# Patient Record
Sex: Female | Born: 1950 | Race: White | Hispanic: No | Marital: Married
Health system: Midwestern US, Academic
[De-identification: ages and names within clinical notes are randomized; demographics above are authoritative.]

---

## 2018-01-18 ENCOUNTER — Encounter: Admit: 2018-01-18 | Discharge: 2018-01-18 | Payer: MEDICARE

## 2018-01-18 ENCOUNTER — Ambulatory Visit: Admit: 2018-01-18 | Discharge: 2018-01-19 | Payer: MEDICARE

## 2018-01-18 DIAGNOSIS — K859 Acute pancreatitis without necrosis or infection, unspecified: Principal | ICD-10-CM

## 2018-01-18 DIAGNOSIS — R42 Dizziness and giddiness: Secondary | ICD-10-CM

## 2018-01-19 DIAGNOSIS — R930 Abnormal findings on diagnostic imaging of skull and head, not elsewhere classified: Principal | ICD-10-CM

## 2018-01-31 ENCOUNTER — Encounter: Admit: 2018-01-31 | Discharge: 2018-01-31 | Payer: MEDICARE

## 2018-02-06 ENCOUNTER — Ambulatory Visit: Admit: 2018-02-06 | Discharge: 2018-02-06 | Payer: MEDICARE

## 2018-02-06 ENCOUNTER — Ambulatory Visit: Admit: 2018-02-06 | Discharge: 2018-02-07 | Payer: MEDICARE

## 2018-02-06 DIAGNOSIS — R930 Abnormal findings on diagnostic imaging of skull and head, not elsewhere classified: ICD-10-CM

## 2018-02-06 DIAGNOSIS — R42 Dizziness and giddiness: Principal | ICD-10-CM

## 2018-02-06 MED ORDER — GADOBENATE DIMEGLUMINE 529 MG/ML (0.1MMOL/0.2ML) IV SOLN
12 mL | Freq: Once | INTRAVENOUS | 0 refills | Status: CP
Start: 2018-02-06 — End: ?
  Administered 2018-02-06: 19:00:00 12 mL via INTRAVENOUS

## 2018-02-07 ENCOUNTER — Encounter: Admit: 2018-02-07 | Discharge: 2018-02-07 | Payer: MEDICARE

## 2018-02-18 ENCOUNTER — Encounter: Admit: 2018-02-18 | Discharge: 2018-02-18 | Payer: MEDICARE

## 2018-03-04 ENCOUNTER — Ambulatory Visit: Admit: 2018-03-04 | Discharge: 2018-03-04 | Payer: MEDICARE

## 2018-03-04 DIAGNOSIS — R930 Abnormal findings on diagnostic imaging of skull and head, not elsewhere classified: Principal | ICD-10-CM

## 2018-03-04 DIAGNOSIS — R42 Dizziness and giddiness: ICD-10-CM

## 2018-03-05 ENCOUNTER — Encounter: Admit: 2018-03-05 | Discharge: 2018-03-05 | Payer: MEDICARE

## 2018-03-11 ENCOUNTER — Encounter: Admit: 2018-03-11 | Discharge: 2018-03-11 | Payer: MEDICARE

## 2018-03-11 DIAGNOSIS — R2990 Unspecified symptoms and signs involving the nervous system: Principal | ICD-10-CM

## 2018-03-12 ENCOUNTER — Encounter: Admit: 2018-03-12 | Discharge: 2018-03-12 | Payer: MEDICARE

## 2018-03-24 ENCOUNTER — Ambulatory Visit: Admit: 2018-03-24 | Discharge: 2018-03-24 | Payer: MEDICARE

## 2018-03-24 DIAGNOSIS — R2990 Unspecified symptoms and signs involving the nervous system: Principal | ICD-10-CM

## 2018-03-24 MED ORDER — GADOBENATE DIMEGLUMINE 529 MG/ML (0.1MMOL/0.2ML) IV SOLN
12 mL | Freq: Once | INTRAVENOUS | 0 refills | Status: CP
Start: 2018-03-24 — End: ?
  Administered 2018-03-24: 19:00:00 12 mL via INTRAVENOUS

## 2018-03-25 ENCOUNTER — Ambulatory Visit: Admit: 2018-03-25 | Discharge: 2018-03-25 | Payer: MEDICARE

## 2018-03-25 DIAGNOSIS — R2990 Unspecified symptoms and signs involving the nervous system: Principal | ICD-10-CM

## 2018-03-25 MED ORDER — GADOBENATE DIMEGLUMINE 529 MG/ML (0.1MMOL/0.2ML) IV SOLN
10 mL | Freq: Once | INTRAVENOUS | 0 refills | Status: CP
Start: 2018-03-25 — End: ?
  Administered 2018-03-25: 21:00:00 10 mL via INTRAVENOUS

## 2018-03-26 ENCOUNTER — Encounter: Admit: 2018-03-26 | Discharge: 2018-03-26 | Payer: MEDICARE

## 2018-03-26 ENCOUNTER — Ambulatory Visit: Admit: 2018-03-26 | Discharge: 2018-03-27 | Payer: MEDICARE

## 2018-03-26 DIAGNOSIS — M859 Disorder of bone density and structure, unspecified: Secondary | ICD-10-CM

## 2018-03-26 DIAGNOSIS — K859 Acute pancreatitis without necrosis or infection, unspecified: Principal | ICD-10-CM

## 2018-03-27 DIAGNOSIS — S2231XA Fracture of one rib, right side, initial encounter for closed fracture: ICD-10-CM

## 2018-03-27 DIAGNOSIS — R202 Paresthesia of skin: ICD-10-CM

## 2018-03-27 DIAGNOSIS — R42 Dizziness and giddiness: Principal | ICD-10-CM

## 2018-03-27 LAB — ANTI-NUCLEAR ANTIBODY(ANA)

## 2018-03-27 LAB — CBC AND DIFF
Lab: 0.1 10*3/uL (ref 0–0.20)
Lab: 0.1 10*3/uL (ref 0–0.45)
Lab: 0.4 10*3/uL (ref 0–0.80)
Lab: 1 % (ref 0–2)
Lab: 1.6 10*3/uL (ref 1.0–4.8)
Lab: 14 % (ref 11–15)
Lab: 14 g/dL (ref 12.0–15.0)
Lab: 2 % (ref 0–5)
Lab: 276 10*3/uL (ref 150–400)
Lab: 28 % (ref 24–44)
Lab: 3.6 10*3/uL (ref 1.8–7.0)
Lab: 30 pg (ref 26–34)
Lab: 5.8 10*3/uL (ref 4.5–11.0)
Lab: 61 % (ref 41–77)
Lab: 8 % (ref 4–12)
Lab: 9 FL (ref 7–11)
Lab: 90 FL (ref 80–100)

## 2018-03-27 LAB — 25-OH VITAMIN D (D2 + D3): Lab: 26 ng/mL — ABNORMAL LOW (ref 30–80)

## 2018-03-27 LAB — PROTIME INR (PT): Lab: 1 M/UL (ref 0.8–1.2)

## 2018-03-27 LAB — VITAMIN B12: Lab: 338 pg/mL (ref 180–914)

## 2018-03-27 LAB — ANTI-NUCLEAR AB(ANA)-QUANT: Lab: 80 — ABNORMAL HIGH (ref <80)

## 2018-03-28 ENCOUNTER — Encounter: Admit: 2018-03-28 | Discharge: 2018-03-28 | Payer: MEDICARE

## 2018-04-18 ENCOUNTER — Encounter: Admit: 2018-04-18 | Discharge: 2018-04-18 | Payer: MEDICARE

## 2018-04-19 ENCOUNTER — Encounter: Admit: 2018-04-19 | Discharge: 2018-04-19 | Payer: MEDICARE

## 2018-04-22 ENCOUNTER — Encounter: Admit: 2018-04-22 | Discharge: 2018-04-22 | Payer: MEDICARE

## 2018-05-08 ENCOUNTER — Encounter: Admit: 2018-05-08 | Discharge: 2018-05-08 | Payer: MEDICARE

## 2018-05-08 ENCOUNTER — Ambulatory Visit: Admit: 2018-05-08 | Discharge: 2018-05-09 | Payer: MEDICARE

## 2018-05-08 DIAGNOSIS — K859 Acute pancreatitis without necrosis or infection, unspecified: Principal | ICD-10-CM

## 2018-05-09 DIAGNOSIS — R42 Dizziness and giddiness: ICD-10-CM

## 2018-05-09 DIAGNOSIS — G379 Demyelinating disease of central nervous system, unspecified: Principal | ICD-10-CM

## 2018-05-10 ENCOUNTER — Encounter: Admit: 2018-05-10 | Discharge: 2018-05-10 | Payer: MEDICARE

## 2018-05-14 ENCOUNTER — Ambulatory Visit: Admit: 2018-05-14 | Discharge: 2018-05-14 | Payer: MEDICARE

## 2018-05-14 DIAGNOSIS — R42 Dizziness and giddiness: Principal | ICD-10-CM

## 2018-05-15 ENCOUNTER — Encounter: Admit: 2018-05-15 | Discharge: 2018-05-15 | Payer: MEDICARE

## 2018-05-21 ENCOUNTER — Encounter: Admit: 2018-05-21 | Discharge: 2018-05-21 | Payer: MEDICARE

## 2018-05-30 ENCOUNTER — Encounter: Admit: 2018-05-30 | Discharge: 2018-05-30 | Payer: MEDICARE

## 2018-06-17 ENCOUNTER — Encounter: Admit: 2018-06-17 | Discharge: 2018-06-17 | Payer: MEDICARE

## 2018-06-17 ENCOUNTER — Ambulatory Visit: Admit: 2018-06-17 | Discharge: 2018-06-17 | Payer: MEDICARE

## 2018-06-17 ENCOUNTER — Ambulatory Visit: Admit: 2018-06-17 | Discharge: 2018-06-18 | Payer: MEDICARE

## 2018-06-17 DIAGNOSIS — K859 Acute pancreatitis without necrosis or infection, unspecified: Secondary | ICD-10-CM

## 2018-06-17 MED ORDER — HYDROCHLOROTHIAZIDE 12.5 MG PO CAP
12.5 mg | ORAL_CAPSULE | Freq: Every morning | ORAL | 3 refills | 30.00000 days | Status: AC
Start: 2018-06-17 — End: 2020-01-15

## 2018-06-18 DIAGNOSIS — R42 Dizziness and giddiness: Secondary | ICD-10-CM

## 2018-06-19 ENCOUNTER — Encounter: Admit: 2018-06-19 | Discharge: 2018-06-19 | Payer: MEDICARE

## 2018-07-31 ENCOUNTER — Ambulatory Visit: Admit: 2018-07-31 | Discharge: 2018-07-31 | Payer: MEDICARE

## 2018-07-31 ENCOUNTER — Encounter: Admit: 2018-07-31 | Discharge: 2018-07-31 | Payer: MEDICARE

## 2018-07-31 DIAGNOSIS — R42 Dizziness and giddiness: Principal | ICD-10-CM

## 2018-07-31 DIAGNOSIS — H903 Sensorineural hearing loss, bilateral: ICD-10-CM

## 2018-07-31 DIAGNOSIS — K859 Acute pancreatitis without necrosis or infection, unspecified: Principal | ICD-10-CM

## 2018-10-20 ENCOUNTER — Encounter: Admit: 2018-10-20 | Discharge: 2018-10-20 | Payer: MEDICARE

## 2018-12-11 ENCOUNTER — Encounter: Admit: 2018-12-11 | Discharge: 2018-12-11

## 2019-01-08 ENCOUNTER — Encounter: Admit: 2019-01-08 | Discharge: 2019-01-08

## 2019-01-09 ENCOUNTER — Encounter: Admit: 2019-01-09 | Discharge: 2019-01-09

## 2019-01-15 ENCOUNTER — Ambulatory Visit: Admit: 2019-01-15 | Discharge: 2019-01-15

## 2019-01-15 ENCOUNTER — Encounter: Admit: 2019-01-15 | Discharge: 2019-01-15

## 2019-01-15 DIAGNOSIS — R42 Dizziness and giddiness: Secondary | ICD-10-CM

## 2019-01-15 DIAGNOSIS — K859 Acute pancreatitis without necrosis or infection, unspecified: Secondary | ICD-10-CM

## 2019-01-15 DIAGNOSIS — G379 Demyelinating disease of central nervous system, unspecified: Principal | ICD-10-CM

## 2019-01-15 NOTE — Assessment & Plan Note
Improved with low salt diet, continue   Follow up with ENT.

## 2019-01-15 NOTE — Progress Notes
Date of Service: 01/15/2019    Subjective:             Rachael Edwards is a 68 y.o. female.    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 She is back to exercising again. She follows up with PCP for her BP and TSH control. She also is receiving Vit B12 injections.           Current Outpatient Medications:   ???  aspirin EC 81 mg tablet, Take 81 mg by mouth daily., Disp: , Rfl:   ???  busPIRone (BUSPAR) 7.5 mg tablet, , Disp: , Rfl:   ???  diazePAM (VALIUM) 5 mg tablet, Take 1 tablet by mouth three times daily., Disp: , Rfl:   ???  fluoxetine (PROZAC) 20 mg capsule, Take 1 capsule by mouth daily., Disp: , Rfl:   ???  hydroCHLOROthiazide (HYDRODIURIL) 12.5 mg capsule, Take one capsule by mouth every morning., Disp: 90 capsule, Rfl: 3  ???  levothyroxine (SYNTHROID) 25 mcg tablet, Take 25 mcg by mouth daily 30 minutes before breakfast., Disp: , Rfl:   ???  loratadine (CLARITIN) 10 mg tablet, Take 1 tablet by mouth daily., Disp: , Rfl:   ???  ondansetron (ZOFRAN ODT) 8 mg rapid dissolve tablet, Dissolve 1 tablet by mouth three times daily., Disp: , Rfl:   ???  triamcinolone (NASACORT) 55 mcg nasal inhaler, Apply 1 spray into nose as directed daily., Disp: , Rfl:      Allergies   Allergen Reactions   ??? Latex, Natural Rubber FLUSHING (SKIN) and RASH   ??? Penicillins HIVES   ??? Cefaclor NAUSEA AND VOMITING   ??? Erythromycin NAUSEA AND VOMITING       Medical History:   Diagnosis Date   ??? Pancreatitis        Family History   Problem Relation Age of Onset   ??? Cancer Father    ??? Thyroid Disease Brother      Social History     Tobacco Use   Smoking Status Former Smoker   ??? Types: Cigarettes   ??? Last attempt to quit: 1974   ??? Years since quitting: 46.6   Smokeless Tobacco Never Used Social History     Substance and Sexual Activity   Alcohol Use Not Currently              Review of Systems   Cardiovascular: Positive for chest pain.   Allergic/Immunologic: Positive for environmental allergies.   All other systems reviewed and are negative.        Objective:         ??? aspirin EC 81 mg tablet Take 81 mg by mouth daily.   ??? busPIRone (BUSPAR) 7.5 mg tablet    ??? diazePAM (VALIUM) 5 mg tablet Take 1 tablet by mouth three times daily.   ??? fluoxetine (PROZAC) 20 mg capsule Take 1 capsule by mouth daily.   ??? hydroCHLOROthiazide (HYDRODIURIL) 12.5 mg capsule Take one capsule by mouth every morning.   ??? levothyroxine (SYNTHROID) 25 mcg tablet Take 25 mcg by mouth daily 30 minutes before breakfast.   ??? loratadine (CLARITIN) 10 mg tablet Take 1 tablet by mouth daily.   ??? ondansetron (ZOFRAN ODT) 8 mg rapid  dissolve tablet Dissolve 1 tablet by mouth three times daily.   ??? triamcinolone (NASACORT) 55 mcg nasal inhaler Apply 1 spray into nose as directed daily.     Vitals:    01/15/19 1417   BP: 120/76   BP Source: Arm, Left Upper   Patient Position: Sitting   Pulse: 66   Temp: 36.7 ???C (98 ???F)   Weight: 47.9 kg (105 lb 9.6 oz)   Height: 152.4 cm (60)   PainSc: Zero     Body mass index is 20.62 kg/m???.     Physical Exam  Depression Screening was performed on Rachael Edwards in clinic today. Based on the score of 0, no follow up action or recommendations are necessary at this time.  Mental Status Exam: Alert oriented to time, person and place,  ???  Speech and Language: Intact for fluency, reception and repetition   ???  ???  Cranial Nerve Exam:   ???  Cranial Nerve II Right Left   Visual Acuity 20/50 20/50   Pupil  Equally reactive to light No RAPD Equally reactive to light No RAPD             ???  Cranial Nerves III-XII Right Left   III, IV, VI (EOM's) Intact  Intact    V Intact  Intact    VII Intact  Intact    VIII Decreased  Decreased more than righ    IX, X Intact  Intact    XI Intact  Intact    XII Intact  Intact    ??? ???           Motor:   ??? R L ??? ??? R L      ??? Hip flexors 5 5      ??? ??? ??? ???   Shoulder Abductors 5 5 ???      Elbow flexors 5 5 ??? ??? ??? ???   Elbow extensors 5 5 ??? Knee flexors 5 5   Wrist flexors 5 5 ??? Knee extensors 5 5   Wrist extensors 5 5 ??? Ankle dorsiflexors 5 5   Finger flexors 5 5 ??? Ankle plantar flexors 5 5   Finger abductors 5 5 ??? ??? ??? ???   Fingers extensors 5 5 ??? ??? ??? ???   ???  Bulk and Tone:  ???  Upper Extremity R L   Atrophy Intact  Intact    Increased Tone Intact  Intact    ???  Lower Extremity R L   Atrophy Intact  Intact    Increased Tone Intact  Intact    ???  Reflexes ??? ???   ??? R L   Biceps ++ ++   Brachioradialis ++ ++   Triceps ++ ++   Knee ++ ++   Ankle ++ ++   ???  Reflexes R L   Hoffman's -ve  -ve   Plantar Equivocal  Equivocal   ???     Sensory Examination temp:  R L   Upper Extremity Intact  Intact    Lower Extremity Intact  Intact       ???  ???       Sensory examination: Vibration in Seconds   ??? R L   Fingers 17 secs  15  secs    Toes 12 secs  8 secs    Ankle ??? ???   ???  Cerebellar/Fine Motor R L   Finger nose finger Intact  Intact    Heel to shin  Intact  Intact    Rapid alternating movements Intact  Intact    ???  Gait: stable no ataxia   Amb index: 4.84  9 hole peg Rh 22.65 , LH 23.12  ???       Assessment and Plan:    Problem   Demyelinating Changes in Brain ([facility])    MRI brain 01/2019  Stable WM lesions consistent with Demyelinating changes   MRI C and T spine no lesions        Vertigo        Demyelinating changes in brain [facility])  The patient is clinically stable   I reviewed the brain MRI and I agree with the report that these are stable   I would continue to monitor off DMT   I advised her to follow up with PCP for BP and lipids control   Repeat MRI of the brain in 1 year wo contrast   Follow up with vitamin B12 and vit D levels with PCP      Vertigo  Improved with low salt diet, continue   Follow up with ENT.

## 2020-01-09 ENCOUNTER — Encounter: Admit: 2020-01-09 | Discharge: 2020-01-09 | Payer: MEDICARE

## 2020-01-14 NOTE — Progress Notes
[facility]  Neuroimmunology/ Neuroinfectious Disease  New Patient Visit - Referred by Dr. Charline Bills  -----------------------------------------------------------------------------------------------------------------------------------------  Chief complaint: dizziness    Sources of history: patient.    History of present illness:  Patient is a 69 y.o. female presenting for further evaluation.  Patient previously followed by Dr. Charline Bills for ongoing dizziness with question of CNS lesions consistent with MS. On prior workup and surveillance imaging, patient with numerous supratentorial periventricular and subcortical white matter lesions consistent with previous disease. However, there has been no enhancement or new lesions seen on subsequent imaging, last obtained in 01/2019. Patient with ongoing dizziness, diagnosed with Menierres disease by Dr. France Ravens, and helped by low sodium diet.     Patient recently went on a trip earlier this month when she had her traditional vertigo. Because of balance problems, had two times where she had fell. Had her zofran and diazepam on board. Had nausea for 15-20 minutes. Per patient, was on salt substitute and had not had increased salt since she's been back. Patient still receiving B12 injections approximately once per month, next due 19th of this month.     ===============================================       EVALUATION/TESTING:  Labs  ANA titer 80, equivocal  Vitami D 26.6 (03/26/18)  B12: 338 (03/26/18)    Physiological Testing  Routine EEG: -  EMU/ vEEG:  -    Basic Structural Imaging (personally reviewed unless otherwise stated)  MRI brain wwo [facility], 01/15/2019):  1. Stable MRI of the head. Unchanged nonenhancing supratentorial white matter lesions. Differential considerations include primary demyelinating disorder, chronic small vessel ischemia, migraine headaches or sequela of prior infectious or inflammatory etiologies.  2. No new lesions. No enhancing lesions.    MRI T/ L Spine ([place], 03/25/18)  IMPRESSION   1. ?No thoracic/lumbar spinal cord signal abnormality or abnormal   enhancement.   2. ?No significant spinal or foraminal stenosis in the thoracic spine.   3. ?Prior L4-L5 laminectomies. 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     MRI C-Spine [facility], 02/06/18)  C-spine:  1.  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  2.  Multilevel degenerative neural foraminal stenosis, greatest of marked on the right at C3-C4 and C5-C6.  3.  Normal signal of the cervical cord without enhancing lesion.    CT Int [facility], 05/14/18):  IMPRESSION   1. 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   2. No evidence of temporal bone fracture or encephalocele.   3. Chronic appearing inferior mastoid sclerosis and mild mucosal   thickening or trace effusion fluid favored for sequelae of old   inflammation      ===============================================  OTHER HISTORY  Past Medical History:  Medical History:   Diagnosis Date   ? Pancreatitis        Past Surgical History:  Surgical History:   Procedure Laterality Date   ? CHOLECYSTECTOMY  1998   ? HYSTERECTOMY  2000   ? BACK SURGERY  1985, 2017       Family History:  Family History   Problem Relation Age of Onset   ? Cancer Father    ? Thyroid Disease Brother        Social History:  Social History     Tobacco Use   ? Smoking status: Former Smoker     Types: Cigarettes  Quit date: 19     Years since quitting: 47.6   ? Smokeless tobacco: Never Used   Substance Use Topics   ? Alcohol use: Not Currently   ? Drug use: Not Currently       Allergies:  Allergies   Allergen Reactions   ? Latex, Natural Rubber FLUSHING (SKIN) and RASH   ? Penicillins HIVES   ? Cefaclor NAUSEA AND VOMITING   ? Erythromycin NAUSEA AND VOMITING       Medications:    Current Outpatient Medications:   ?  aspirin EC 81 mg tablet, Take 81 mg by mouth daily., Disp: , Rfl:   ?  busPIRone (BUSPAR) 7.5 mg tablet, , Disp: , Rfl:   ?  diazePAM (VALIUM) 5 mg tablet, Take 1 tablet by mouth three times daily., Disp: , Rfl:   ?  fluoxetine (PROZAC) 20 mg capsule, Take 1 capsule by mouth daily., Disp: , Rfl:   ?  hydroCHLOROthiazide (HYDRODIURIL) 12.5 mg capsule, Take one capsule by mouth every morning., Disp: 90 capsule, Rfl: 3  ?  levothyroxine (SYNTHROID) 25 mcg tablet, Take 25 mcg by mouth daily 30 minutes before breakfast., Disp: , Rfl:   ?  loratadine (CLARITIN) 10 mg tablet, Take 1 tablet by mouth daily., Disp: , Rfl:   ?  ondansetron (ZOFRAN ODT) 8 mg rapid dissolve tablet, Dissolve 1 tablet by mouth three times daily., Disp: , Rfl:   ?  triamcinolone (NASACORT) 55 mcg nasal inhaler, Apply 1 spray into nose as directed daily., Disp: , Rfl:     Review of Systems:  Review of Systems   Constitutional: Positive for malaise/fatigue. Negative for chills and fever.   HENT: Positive for hearing loss.    Eyes: Negative for double vision.   Respiratory: Negative for cough.    Cardiovascular: Negative for chest pain and palpitations.   Genitourinary: Negative for dysuria.   Musculoskeletal: Negative for myalgias.   Neurological: Positive for dizziness. Negative for loss of consciousness and headaches.   Psychiatric/Behavioral: Negative for depression and memory loss.          PHYSICAL EXAMINATION:  Vitals:   Vitals:    01/15/20 0939   BP: 117/72   Pulse: 80   Weight: 48.1 kg (106 lb)   Height: 152.4 cm (60)   PainSc: Zero      General:  No scleral icterus  Moist mucus membranes  Peripheral pulses Intact  Aerating well  Abdomen soft  No peripheral edema  Skin dry    Neurologic Exam:  Mental status:  ? Alert and oriented x 3  ? Recent and remote memory intact  ? Concentration and attention intact  ? Fund of knowledge intact; mood and affect normal  ? Language including naming, repetition, comprehension is normal and spontaneous speech is normal    Cranial nerves:  ? Eyes - Fundoscopic exam, unable to visualize optic discs  ? Visual acuity is normal and visual fields to confrontation are normal  ? Ocular motility full and pupils are equal round and reactive to light, no RAPD  ? Facial sensation intact in V1-V3   ? Face symmetric and strength normal  ? Hearing decreased to finger rustle bilaterally, R > L  ? Palate elevation, tongue protrusion deferred due to COVID pandemic  ? Shoulder shrug is symmetric    Motor:  ? Muscle bulk and tone are normal in all extremities  ? Muscle strength is normal to confrontation in all extremities    Reflexes:  ?  2+ and symmetric in all four extremities    Sensory:  ? Sensory exam is normal to light touch and temperature    Coordination:  ? Finger to nose testing without dysmetria    Gait:  ? Casual gait and station are normal    ===========================================================================    Impression:  1. Meniere disease, bilateral     2. Vertigo  VITAMIN B12   3. Abnormal MRI of head  VITAMIN B12   4. Disorder of bone density and structure, unspecified  25-OH VITAMIN D (D2 + D3)       VALEN CROFFORD is a 69 y.o. presenting to establish care for previous white matter changes with concern for multiple sclerosis vs clinically isolated syndrome (CIS). Patient previously followed by Dr. Charline Bills, last seen in clinic in 01/2019. Her serial imaging since that time has not demonstrated new lesions on MRI brain or spine. She has remained well without initiation of DMT. 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 She has been symptom free since last winter with only recurrence coming after traveling to [place] for a trip. She has diazepam and zofran for symptomatic relief, but has not required it since coming back. Believe her Menierre's disease is currently stable.     Plan/ Recommendations:  Diagnostic testing:  Ok to differ surveillance MRI brain at this time due to age, clinical stability, and lack of new symtpoms  Would like to obtain annual Vit D level, previously noted to be low  Obtain B12 level as currently on monthly supplementation to help gauge duration     Treatment:  No indication currently for DMT  Continue low Na diet as have been for Menierre's disease  Start Vit D supplementation, D3 5000 IU/ daily    Referrals:  - None.    Follow-up:  - 1 year with me  - Given fatigue and recently started synthroid, encourage follow up with PCP for follow up thyroid studies and further adjustment as needed.     Considerable time was spent on counseling the patient on the following, taking time to answer their quesions for which they verbalized understanding:  - The diagnosis was discussed including its probable causes, an explanation of the terminology, and the prognosis.     - The plan for evaluation and the treatment.    Time-based billing: Of the 60 minute encounter, > 50% of the time was spent on counseling as above.    Waldron Session, M.D.  [facility]  [facility]

## 2020-01-15 ENCOUNTER — Encounter: Admit: 2020-01-15 | Discharge: 2020-01-15 | Payer: MEDICARE

## 2020-01-15 ENCOUNTER — Ambulatory Visit: Admit: 2020-01-15 | Discharge: 2020-01-15 | Payer: MEDICARE

## 2020-01-15 DIAGNOSIS — R930 Abnormal findings on diagnostic imaging of skull and head, not elsewhere classified: Secondary | ICD-10-CM

## 2020-01-15 DIAGNOSIS — M859 Disorder of bone density and structure, unspecified: Secondary | ICD-10-CM

## 2020-01-15 DIAGNOSIS — R42 Dizziness and giddiness: Secondary | ICD-10-CM

## 2020-01-15 DIAGNOSIS — K859 Acute pancreatitis without necrosis or infection, unspecified: Secondary | ICD-10-CM

## 2020-01-15 DIAGNOSIS — G379 Demyelinating disease of central nervous system, unspecified: Secondary | ICD-10-CM

## 2020-01-15 DIAGNOSIS — H8103 Meniere's disease, bilateral: Secondary | ICD-10-CM

## 2020-01-15 LAB — VITAMIN B12: Lab: 438 pg/mL (ref 180–914)

## 2020-01-15 NOTE — Patient Instructions
Planned Diagnostic Testing:  D3 and B12 labs  Ok to defer MRI    Medications:   Please start Vitamin D3 at 5000 IU/ daily    Return to Clinic:  1 year with me

## 2020-03-20 ENCOUNTER — Encounter: Admit: 2020-03-20 | Discharge: 2020-03-20 | Payer: MEDICARE

## 2020-05-06 ENCOUNTER — Encounter: Admit: 2020-05-06 | Discharge: 2020-05-06 | Payer: 59

## 2020-05-06 ENCOUNTER — Ambulatory Visit: Admit: 2020-05-06 | Discharge: 2020-05-06 | Payer: 59

## 2020-05-06 ENCOUNTER — Ambulatory Visit: Admit: 2020-05-06 | Discharge: 2020-05-06 | Payer: MEDICARE

## 2020-05-06 DIAGNOSIS — K859 Acute pancreatitis without necrosis or infection, unspecified: Secondary | ICD-10-CM

## 2020-05-06 DIAGNOSIS — H903 Sensorineural hearing loss, bilateral: Secondary | ICD-10-CM

## 2020-05-06 DIAGNOSIS — R42 Dizziness and giddiness: Secondary | ICD-10-CM

## 2020-05-06 MED ORDER — HYDROCHLOROTHIAZIDE 12.5 MG PO CAP
12.5 mg | ORAL_CAPSULE | Freq: Every morning | ORAL | 0 refills | 30.00000 days | Status: AC
Start: 2020-05-06 — End: ?

## 2020-05-06 NOTE — Progress Notes
Date of Service: 05/06/2020    Subjective:             Rachael Edwards is a 69 y.o. female.    History of Present Illness    Has been having more attacks since August.       Review of Systems   Constitutional: Negative.    HENT: Negative.    Eyes: Negative.    Respiratory: Negative.    Cardiovascular: Negative.    Gastrointestinal: Negative.    Endocrine: Negative.    Genitourinary: Negative.    Musculoskeletal: Negative.    Skin: Negative.    Allergic/Immunologic: Negative.  Negative for environmental allergies.   Neurological: Negative.    Hematological: Negative.    Psychiatric/Behavioral: Negative.          Objective:          aspirin EC 81 mg tablet Take 81 mg by mouth daily.    busPIRone (BUSPAR) 7.5 mg tablet     diazePAM (VALIUM) 5 mg tablet Take 1 tablet by mouth three times daily.    fluoxetine (PROZAC) 20 mg capsule Take 1 capsule by mouth daily.    levothyroxine (SYNTHROID) 25 mcg tablet Take 25 mcg by mouth daily 30 minutes before breakfast.    ondansetron (ZOFRAN ODT) 8 mg rapid dissolve tablet Dissolve 1 tablet by mouth three times daily.    triamcinolone (NASACORT) 55 mcg nasal inhaler Apply 1 spray into nose as directed daily.     Vitals:    05/06/20 1156   BP: 109/71   Pulse: 71   Weight: 49 kg (108 lb)   Height: 152.4 cm (60")   PainSc: Zero     Body mass index is 21.09 kg/m.     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

## 2020-10-11 ENCOUNTER — Encounter: Admit: 2020-10-11 | Discharge: 2020-10-11 | Payer: 59

## 2020-11-24 ENCOUNTER — Encounter: Admit: 2020-11-24 | Discharge: 2020-11-24 | Payer: 59

## 2020-11-24 MED ORDER — HYDROCHLOROTHIAZIDE 12.5 MG PO CAP
12.5 mg | ORAL_CAPSULE | Freq: Every morning | ORAL | 0 refills | 30.00000 days | Status: AC
Start: 2020-11-24 — End: ?

## 2020-11-26 ENCOUNTER — Encounter: Admit: 2020-11-26 | Discharge: 2020-11-26 | Payer: 59

## 2020-11-29 ENCOUNTER — Ambulatory Visit: Admit: 2020-11-29 | Discharge: 2020-11-30 | Payer: MEDICARE

## 2020-11-29 ENCOUNTER — Encounter: Admit: 2020-11-29 | Discharge: 2020-11-29 | Payer: 59

## 2020-11-29 DIAGNOSIS — H8103 Meniere's disease, bilateral: Secondary | ICD-10-CM

## 2020-11-29 DIAGNOSIS — G379 Demyelinating disease of central nervous system, unspecified: Secondary | ICD-10-CM

## 2020-11-29 DIAGNOSIS — K859 Acute pancreatitis without necrosis or infection, unspecified: Secondary | ICD-10-CM

## 2020-11-29 DIAGNOSIS — R42 Dizziness and giddiness: Secondary | ICD-10-CM

## 2020-11-29 DIAGNOSIS — R930 Abnormal findings on diagnostic imaging of skull and head, not elsewhere classified: Secondary | ICD-10-CM

## 2020-11-29 NOTE — Progress Notes
[facility]  Neuroimmunology/ Neuroinfectious Disease  Return Patient Visit  ===========================================================================  Impression:  No diagnosis found.     Rachael Edwards is a 70 y.o. female presenting for followup  for previous white matter changes with concern for multiple sclerosis vs clinically isolated syndrome (CIS). Her serial imaging since that time has not demonstrated new lesions on MRI brain or spine. She has remained well without initiation of DMT. 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 More recently, she has had some breakthrough symptoms in the setting of infection and vaccination, but improving since then. She has diazepam and zofran for symptomatic relief, asked to take it proactively if needed for upcoming trip to [place].      Plan/ Recommendations:  Diagnostic testing:  Would like defer MRI at this time given lack of new symptoms. If persistent or worsening, will obtain.     Treatment:  No indication currently for DMT  Continue low Na diet as have been for Menierre's disease  Continue Vit D supplementation, D3 5000 IU/ daily    Referrals:  - None.    Follow-up:  - 6 months with me    Considerable time was spent on counseling the patient on the following, taking time to answer their quesions for which they verbalized understanding:  - The diagnosis was discussed including its probable causes, an explanation of the terminology, and the prognosis.     - The plan for evaluation and the treatment.    Waldron Session, M.D.  [facility]  [facility]      ===============================================    Chief complaint: dizziness   Sources of history: patient.    Interval history:  Per patient, had COVID the first week of May. In addition, had Shingles shot last week where symptoms came on again. Vertigo seems to be the biggest problem, episodic. Can go months between events, so the last couple months have been unusual. Denies any other symptoms currently. States hearing has been slightly worsening with new hearing aid settings last year. She continues to follow with her audiologist. No other concerns at this time.       Previous history, updated and reviewed.  Patient is a 70 y.o. female presenting for further evaluation.  Patient previously followed by Dr. Charline Bills for ongoing dizziness with question of CNS lesions consistent with MS. On prior workup and surveillance imaging, patient with numerous supratentorial periventricular and subcortical white matter lesions consistent with previous disease. However, there has been no enhancement or new lesions seen on subsequent imaging, last obtained in 01/2019. Patient with ongoing dizziness, diagnosed with Menierres disease by Dr. France Ravens, and helped by low sodium diet.     Patient recently went on a trip earlier this month when she had her traditional vertigo. Because of balance problems, had two times where she had fell. Had her zofran and diazepam on board. Had nausea for 15-20 minutes. Per patient, was on salt substitute and had not had increased salt since she's been back. Patient still receiving B12 injections approximately once per month, next due 19th of this month.     ===============================================       EVALUATION/TESTING:  Labs  ANA titer 80, equivocal  Vitami D 26.6 (03/26/18)  B12: 338 (03/26/18)    Physiological Testing  Routine EEG: -  EMU/ vEEG:  -    Basic Structural Imaging (personally reviewed unless otherwise stated)  MRI brain wwo [facility], 01/15/2019):  1. Stable MRI of the head. Unchanged nonenhancing supratentorial white matter lesions. Differential considerations include primary demyelinating disorder, chronic small vessel ischemia, migraine headaches or sequela of prior infectious or inflammatory etiologies.  2. No new lesions. No enhancing lesions.    MRI T/ L Spine ([facility], 03/25/18)  IMPRESSION   1. ?No thoracic/lumbar spinal cord signal abnormality or abnormal   enhancement.   2. ?No significant spinal or foraminal stenosis in the thoracic spine.   3. ?Prior L4-L5 laminectomies. 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     MRI C-Spine [facility], 02/06/18)  C-spine:  1.  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  2.  Multilevel degenerative neural foraminal stenosis, greatest of marked on the right at C3-C4 and C5-C6.  3.  Normal signal of the cervical cord without enhancing lesion.    CT Int Hermenia Bers [facility], 05/14/18):  IMPRESSION   1. 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   2. No evidence of temporal bone fracture or encephalocele.   3. Chronic appearing inferior mastoid sclerosis and mild mucosal   thickening or trace effusion fluid favored for sequelae of old   inflammation      ===============================================  OTHER HISTORY  Past Medical History:  Medical History:   Diagnosis Date   ? Pancreatitis        Past Surgical History:  Surgical History:   Procedure Laterality Date   ? CHOLECYSTECTOMY  1998   ? HYSTERECTOMY  2000   ? BACK SURGERY  1985, 2017       Family History:  Family History   Problem Relation Age of Onset   ? Cancer Father    ? Thyroid Disease Brother        Social History:  Social History     Tobacco Use   ? Smoking status: Former Smoker     Types: Cigarettes     Quit date: 1974     Years since quitting: 48.5   ? Smokeless tobacco: Never Used   Substance Use Topics   ? Alcohol use: Not Currently   ? Drug use: Not Currently       Allergies:  Allergies   Allergen Reactions   ? Latex, Natural Rubber FLUSHING (SKIN) and RASH   ? Penicillins HIVES   ? Cefaclor NAUSEA AND VOMITING   ? Erythromycin NAUSEA AND VOMITING Medications:    Current Outpatient Medications:   ?  aspirin EC 81 mg tablet, Take 81 mg by mouth daily., Disp: , Rfl:   ?  busPIRone (BUSPAR) 7.5 mg tablet, , Disp: , Rfl:   ?  diazePAM (VALIUM) 5 mg tablet, Take 1 tablet by mouth three times daily., Disp: , Rfl:   ?  fluoxetine (PROZAC) 20 mg capsule, Take 1 capsule by mouth daily., Disp: , Rfl:   ?  hydroCHLOROthiazide 12.5 mg capsule, TAKE ONE CAPSULE BY MOUTH EVERY MORNING, Disp: 90 capsule, Rfl: 0  ?  levothyroxine (SYNTHROID) 25 mcg tablet, Take 25 mcg by mouth daily 30 minutes before breakfast., Disp: , Rfl:   ?  ondansetron (ZOFRAN ODT) 8 mg rapid dissolve tablet, Dissolve 1 tablet by mouth three times daily., Disp: , Rfl:   ?  triamcinolone (NASACORT) 55 mcg nasal inhaler, Apply 1 spray into nose as directed daily., Disp: , Rfl:  Review of Systems:  Review of Systems   Constitutional: Positive for malaise/fatigue. Negative for chills and fever.   HENT: Positive for hearing loss.    Eyes: Negative for double vision.   Respiratory: Negative for cough.    Cardiovascular: Negative for chest pain and palpitations.   Genitourinary: Negative for dysuria.   Musculoskeletal: Negative for myalgias.   Neurological: Positive for dizziness. Negative for loss of consciousness and headaches.   Psychiatric/Behavioral: Negative for depression and memory loss.          PHYSICAL EXAMINATION:  Vitals:   Vitals:    11/29/20 1254   BP: 102/66   Pulse: 66   PainSc: Zero   Weight: 46.7 kg (103 lb)   Height: 152.4 cm (5')      General:  No scleral icterus  Moist mucus membranes  Peripheral pulses Intact  Aerating well  Abdomen soft  No peripheral edema  Skin dry    Neurologic Exam:  Mental status:  ? Alert and oriented x 3  ? Recent and remote memory intact  ? Concentration and attention intact  ? Fund of knowledge intact; mood and affect normal  ? Language including naming, repetition, comprehension is normal and spontaneous speech is normal    Cranial nerves:  ? Eyes - Fundoscopic exam, unable to visualize optic discs  ? Visual acuity is normal and visual fields to confrontation are normal  ? Ocular motility full and pupils are equal round and reactive to light, no RAPD  ? Facial sensation intact in V1-V3   ? Face symmetric and strength normal  ? Hearing decreased to finger rustle bilaterally, R > L  ? Palate elevation, tongue protrusion deferred due to COVID pandemic  ? Shoulder shrug is symmetric    Motor:  ? Muscle bulk and tone are normal in all extremities  ? Muscle strength is normal to confrontation in all extremities except for right hip flexor with slight weakness noted, 4+/5    Reflexes:  ? 2+ and symmetric in all four extremities    Sensory:  ? Sensory exam is normal to light touch and temperature    Coordination:  ? Finger to nose testing without dysmetria    Gait:  ? Casual gait and station are normal

## 2020-12-13 ENCOUNTER — Encounter: Admit: 2020-12-13 | Discharge: 2020-12-13 | Payer: 59

## 2021-01-03 ENCOUNTER — Encounter: Admit: 2021-01-03 | Discharge: 2021-01-03 | Payer: 59

## 2021-01-11 ENCOUNTER — Encounter: Admit: 2021-01-11 | Discharge: 2021-01-11 | Payer: 59

## 2021-02-01 ENCOUNTER — Encounter: Admit: 2021-02-01 | Discharge: 2021-02-01 | Payer: 59

## 2021-02-10 ENCOUNTER — Encounter: Admit: 2021-02-10 | Discharge: 2021-02-10 | Payer: 59

## 2021-02-10 ENCOUNTER — Ambulatory Visit: Admit: 2021-02-10 | Discharge: 2021-02-10 | Payer: 59

## 2021-02-10 ENCOUNTER — Ambulatory Visit: Admit: 2021-02-10 | Discharge: 2021-02-10 | Payer: MEDICARE

## 2021-02-10 DIAGNOSIS — H903 Sensorineural hearing loss, bilateral: Secondary | ICD-10-CM

## 2021-02-10 DIAGNOSIS — R42 Dizziness and giddiness: Secondary | ICD-10-CM

## 2021-02-10 DIAGNOSIS — G379 Demyelinating disease of central nervous system, unspecified: Secondary | ICD-10-CM

## 2021-02-10 DIAGNOSIS — K859 Acute pancreatitis without necrosis or infection, unspecified: Secondary | ICD-10-CM

## 2021-02-10 MED ORDER — TRIAMTERENE-HYDROCHLOROTHIAZID 37.5-25 MG PO CAP
1 | ORAL_CAPSULE | Freq: Every morning | ORAL | 0 refills | Status: DC
Start: 2021-02-10 — End: 2021-02-10

## 2021-02-10 MED ORDER — TRIAMTERENE-HYDROCHLOROTHIAZID 37.5-25 MG PO CAP
1 | ORAL_CAPSULE | Freq: Every morning | ORAL | 0 refills | Status: AC
Start: 2021-02-10 — End: ?

## 2021-02-10 MED ORDER — GADOBENATE DIMEGLUMINE 529 MG/ML (0.1MMOL/0.2ML) IV SOLN
8 mL | Freq: Once | INTRAVENOUS | 0 refills | Status: CP
Start: 2021-02-10 — End: ?
  Administered 2021-02-10: 17:00:00 8 mL via INTRAVENOUS

## 2021-02-10 NOTE — Progress Notes
Date of Service: 02/10/2021    Subjective:             Rachael Edwards is a 70 y.o. female.    History of Present Illness    Stopped dyazide in April and has started having attacks again.  Left ear is fluctuating.       Review of Systems   Constitutional: Negative.    HENT: Negative.    Eyes: Negative.    Respiratory: Negative.    Cardiovascular: Negative.    Gastrointestinal: Negative.    Endocrine: Negative.    Genitourinary: Negative.    Musculoskeletal: Negative.    Skin: Negative.    Allergic/Immunologic: Negative.    Neurological: Negative.    Hematological: Negative.    Psychiatric/Behavioral: Negative.    All other systems reviewed and are negative.        Objective:          aspirin EC 81 mg tablet Take 81 mg by mouth daily.    busPIRone (BUSPAR) 7.5 mg tablet twice daily.    diazePAM (VALIUM) 5 mg tablet Take 1 tablet by mouth three times daily.    fluoxetine (PROZAC) 20 mg capsule Take 1 capsule by mouth daily.    hydroCHLOROthiazide 12.5 mg capsule TAKE ONE CAPSULE BY MOUTH EVERY MORNING    levothyroxine (SYNTHROID) 25 mcg tablet Take 25 mcg by mouth daily 30 minutes before breakfast.    ondansetron (ZOFRAN ODT) 8 mg rapid dissolve tablet Dissolve 1 tablet by mouth three times daily.    triamcinolone (NASACORT) 55 mcg nasal inhaler Apply 1 spray into nose as directed daily.     Vitals:    02/10/21 0907   BP: 118/74   Pulse: 76   PainSc: Zero   Weight: 44.5 kg (98 lb)   Height: 152.4 cm (5')     Body mass index is 19.14 kg/m.     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  If she has more falls she would be a candidate for a left ELS surgery.  Also discussed [employer] clinical trial.

## 2021-02-14 ENCOUNTER — Encounter: Admit: 2021-02-14 | Discharge: 2021-02-14 | Payer: 59

## 2021-02-14 DIAGNOSIS — Z006 Encounter for examination for normal comparison and control in clinical research program: Secondary | ICD-10-CM

## 2021-02-15 ENCOUNTER — Encounter: Admit: 2021-02-15 | Discharge: 2021-02-15 | Payer: 59

## 2021-03-07 IMAGING — MG 3D MAMMO BILAT DIAGNOSITC
8 of 15 series · 8 of 35 positions shown · non-contrast
Comparison: Mammograms of 05/27/2015 and 04/16/2014.

3D MAMMO BILAT DIAGNOSITC
INDICATION: Pain and lump in the left breast.
TECHNIQUE: 2D and 3D bilateral diagnostic mammography was performed with  

 CAD. A BB marker was placed at the area of lump and pain in the  
 upper left breast.

[R MLO]
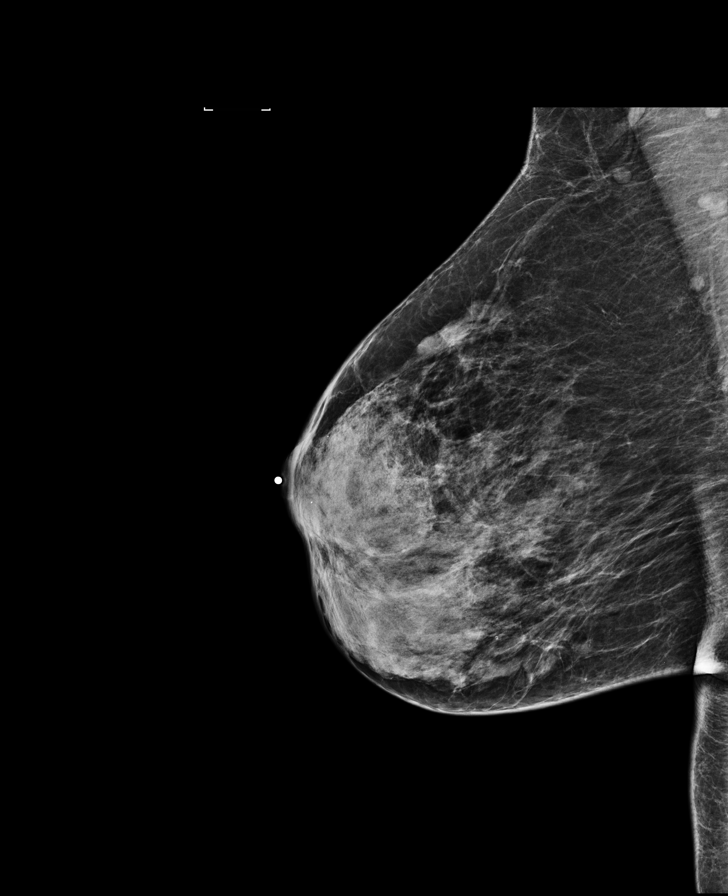

[L MLO synth-2D]
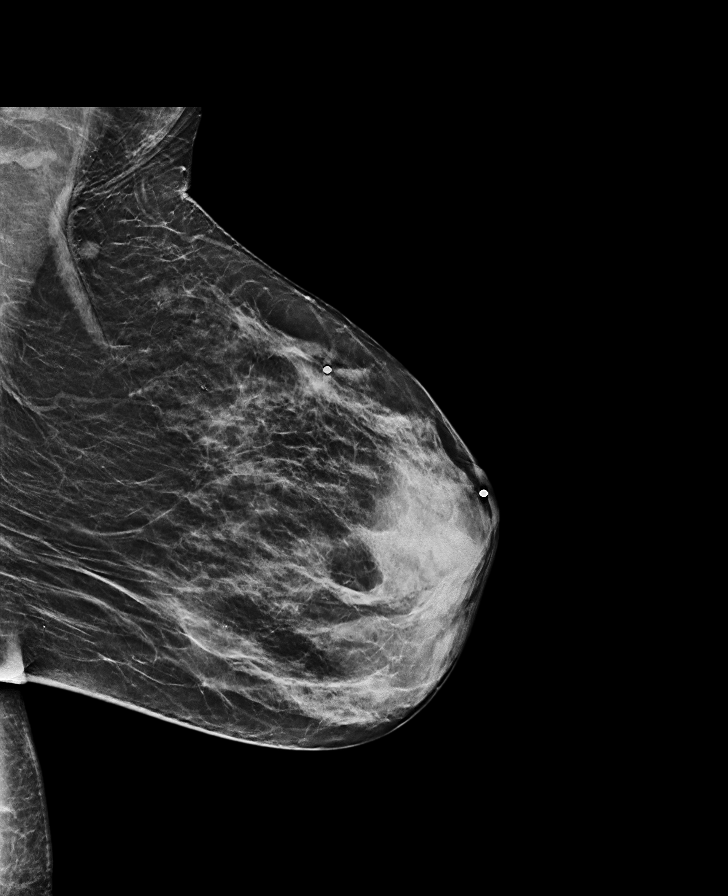

[L ML]
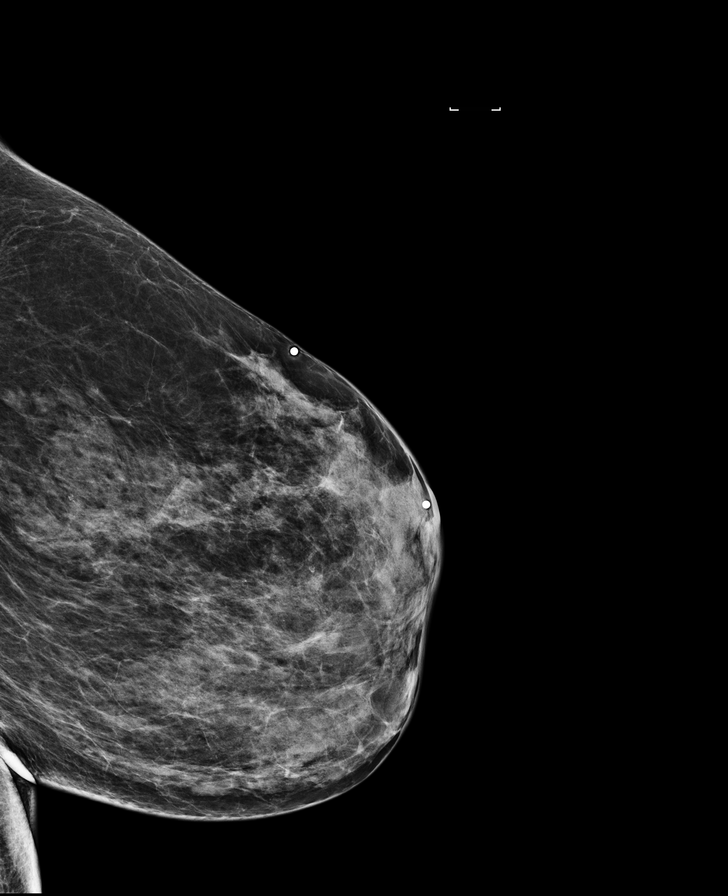

[L ML synth-2D]
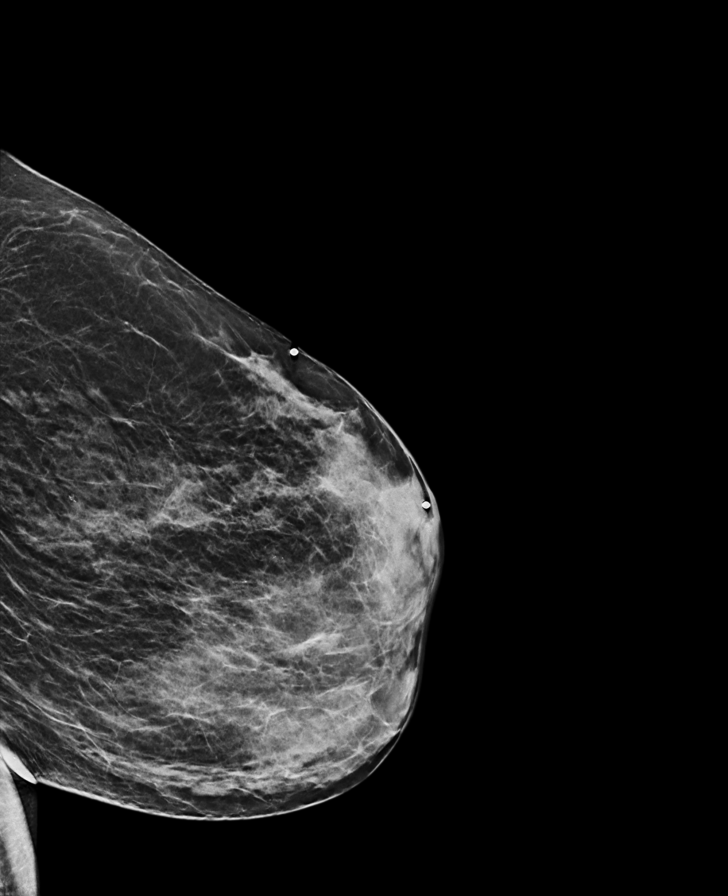

[R MLO synth-2D]
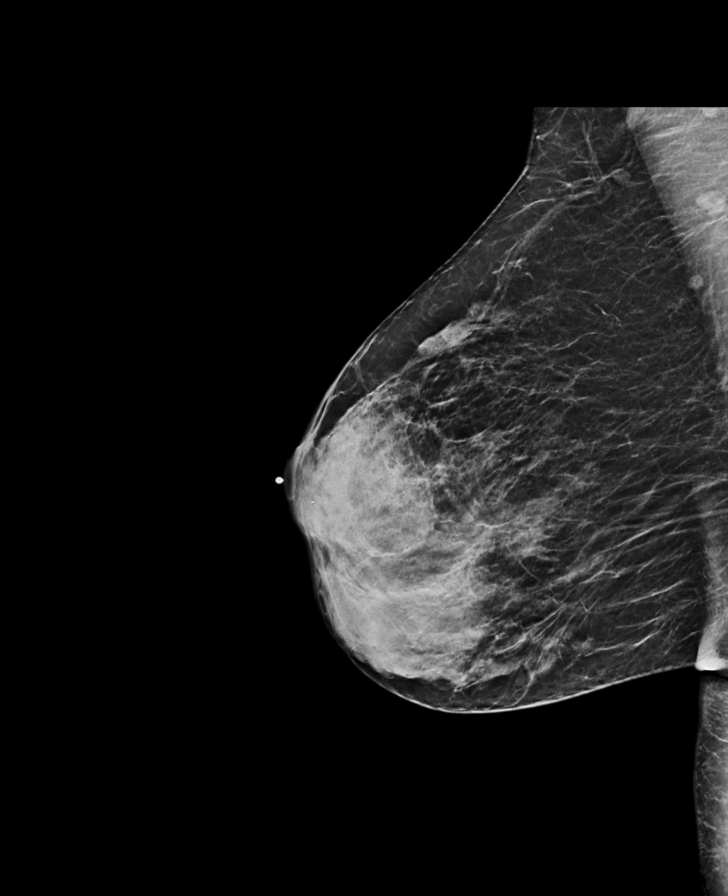

[R CC synth-2D]
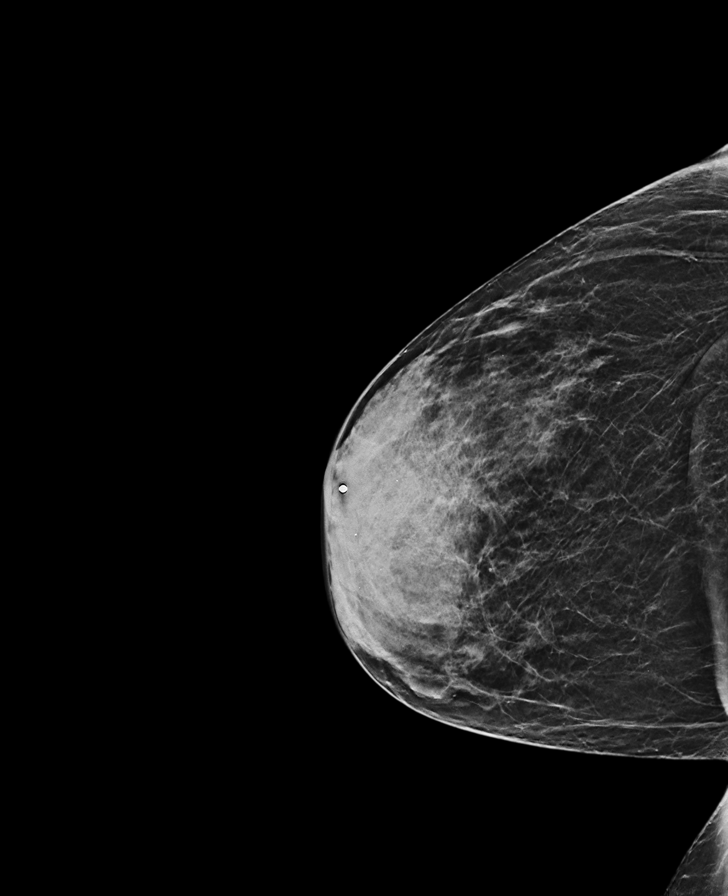

[L MLO]
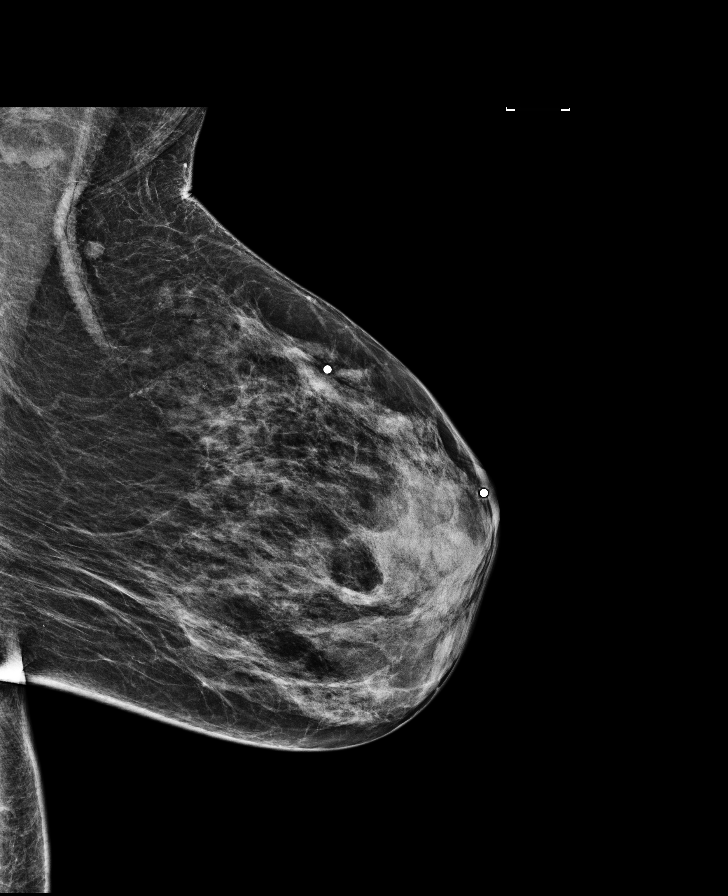

[L CC]
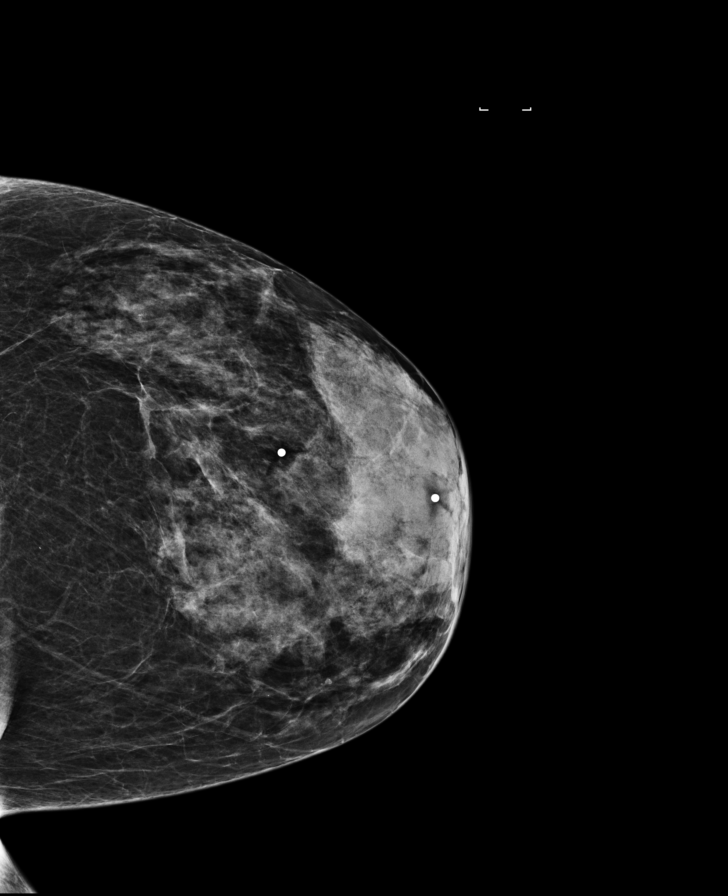

[8 of 35 positions shown; findings below may reference images not displayed]

FINDINGS: Both breasts remain heterogeneously dense, limiting the  

 sensitivity of mammography. No dominant mass or malignant  

 appearing microcalcifications are seen. The axillae are  

 unremarkable.
IMPRESSION: No mammographic features suspicious for malignancy are  

 identified. Even so, directed sonographic interrogation of the  

 area of pain and lump in the upper left breast is recommended and  

 will be performed today.  

 ACR BI-RADS Category 0: Incomplete. (Needs additional imaging  

 evaluation).  

 Result letter will be mailed to the patient.  

 Note: At least 10% of breast cancer is not imaged by mammography.

## 2021-03-08 ENCOUNTER — Encounter: Admit: 2021-03-08 | Discharge: 2021-03-08 | Payer: 59

## 2021-03-08 ENCOUNTER — Ambulatory Visit: Admit: 2021-03-08 | Discharge: 2021-03-08 | Payer: MEDICARE

## 2021-03-08 ENCOUNTER — Ambulatory Visit: Admit: 2021-03-08 | Discharge: 2021-03-08 | Payer: 59

## 2021-03-08 DIAGNOSIS — Z006 Encounter for examination for normal comparison and control in clinical research program: Secondary | ICD-10-CM

## 2021-03-08 NOTE — Progress Notes
Audiogram completed per research study protocol.

## 2021-03-20 ENCOUNTER — Encounter: Admit: 2021-03-20 | Discharge: 2021-03-20 | Payer: 59

## 2021-04-01 ENCOUNTER — Encounter: Admit: 2021-04-01 | Discharge: 2021-04-01 | Payer: 59

## 2021-04-01 ENCOUNTER — Ambulatory Visit: Admit: 2021-04-01 | Discharge: 2021-04-01 | Payer: MEDICARE

## 2021-04-01 ENCOUNTER — Ambulatory Visit: Admit: 2021-04-01 | Discharge: 2021-04-01 | Payer: 59

## 2021-04-01 DIAGNOSIS — Z006 Encounter for examination for normal comparison and control in clinical research program: Secondary | ICD-10-CM

## 2021-04-01 DIAGNOSIS — H903 Sensorineural hearing loss, bilateral: Secondary | ICD-10-CM

## 2021-04-01 NOTE — Progress Notes
Testing performed today in compliance with research study. Patient to follow-up per study protocol.

## 2021-05-03 ENCOUNTER — Encounter: Admit: 2021-05-03 | Discharge: 2021-05-03 | Payer: 59

## 2021-05-05 ENCOUNTER — Ambulatory Visit: Admit: 2021-05-05 | Discharge: 2021-05-05 | Payer: 59

## 2021-05-05 ENCOUNTER — Encounter: Admit: 2021-05-05 | Discharge: 2021-05-05 | Payer: 59

## 2021-05-05 ENCOUNTER — Ambulatory Visit: Admit: 2021-05-05 | Discharge: 2021-05-05 | Payer: MEDICARE

## 2021-05-05 DIAGNOSIS — Z006 Encounter for examination for normal comparison and control in clinical research program: Secondary | ICD-10-CM

## 2021-05-05 DIAGNOSIS — H903 Sensorineural hearing loss, bilateral: Secondary | ICD-10-CM

## 2021-05-05 LAB — RESEARCH BLOOD COLLECTION ONLY

## 2021-05-05 NOTE — Progress Notes
Testing performed today in compliance with research study. Patient to follow-up per study protocol.

## 2021-05-09 ENCOUNTER — Encounter: Admit: 2021-05-09 | Discharge: 2021-05-09 | Payer: 59

## 2021-05-09 DIAGNOSIS — H903 Sensorineural hearing loss, bilateral: Secondary | ICD-10-CM

## 2021-05-09 DIAGNOSIS — H8102 Meniere's disease, left ear: Secondary | ICD-10-CM

## 2021-05-09 MED ORDER — HYDROCHLOROTHIAZIDE 25 MG PO TAB
25 mg | ORAL_TABLET | Freq: Every morning | ORAL | 3 refills | 28.00000 days | Status: AC
Start: 2021-05-09 — End: ?

## 2021-06-01 ENCOUNTER — Encounter: Admit: 2021-06-01 | Discharge: 2021-06-01 | Payer: 59

## 2021-06-01 ENCOUNTER — Ambulatory Visit: Admit: 2021-06-01 | Discharge: 2021-06-01 | Payer: MEDICARE

## 2021-06-01 DIAGNOSIS — K859 Acute pancreatitis without necrosis or infection, unspecified: Secondary | ICD-10-CM

## 2021-06-01 DIAGNOSIS — G379 Demyelinating disease of central nervous system, unspecified: Secondary | ICD-10-CM

## 2021-06-01 DIAGNOSIS — R930 Abnormal findings on diagnostic imaging of skull and head, not elsewhere classified: Secondary | ICD-10-CM

## 2021-06-01 DIAGNOSIS — H8103 Meniere's disease, bilateral: Secondary | ICD-10-CM

## 2021-06-01 NOTE — Progress Notes
[facility]  Neuroimmunology/ Neuroinfectious Disease  Return Patient Visit  ===========================================================================  Impression:  1. 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 Meniere disease, bilateral        3. Abnormal MRI of head             Rachael Edwards is a 70 y.o. female presenting for followup for previous white matter changes with concern for multiple sclerosis vs clinically isolated syndrome (CIS). Her serial imaging since 2020 has not demonstrated new lesions on MRI brain or spine. She has remained well without initiation of DMT.     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 She currently undergoing a clinical trial through ENT has been doing great. As such, will defer surveillance imaging at this time and obtain if patient with return of any clinical symptoms. All questions answered by patient and husband.      Plan/ Recommendations:  Diagnostic testing:  Would like defer MRI at this time given lack of new symptoms. If persistent or worsening, will obtain.     Treatment:  No indication currently for DMT  Continue low Na diet as have been for Menierre's disease  Continue Vit D supplementation, D3 5000 IU/ daily    Referrals:  - None.    Follow-up:  - 6 months with me    Considerable time was spent on counseling the patient on the following, taking time to answer their quesions for which they verbalized understanding:  - The diagnosis was discussed including its probable causes, an explanation of the terminology, and the prognosis.     - The plan for evaluation and the treatment.    Waldron Session, M.D.  [facility]  [facility]      ===============================================    Chief complaint: dizziness   Sources of history: patient.    Interval history:  Patient doing very well with treatment with ENT for Menierre's disease. Doing well over November and December, even when pushed for the holiday. New no symptoms or breakthrough events. Per patinet and husband, doing very well.       Previous history, updated and reviewed.  Patient is a 70 y.o. female presenting for further evaluation.  Patient previously followed by Dr. Charline Bills for ongoing dizziness with question of CNS lesions consistent with MS. On prior workup and surveillance imaging, patient with numerous supratentorial periventricular and subcortical white matter lesions consistent with previous disease. However, there has been no enhancement or new lesions seen on subsequent imaging, last obtained in 01/2019. Patient with ongoing dizziness, diagnosed with Menierres disease by Dr. France Ravens, and helped by low sodium diet.     Patient recently went on a trip earlier this month when she had her traditional vertigo. Because of balance problems, had two times where she had fell. Had her zofran and diazepam on board. Had nausea for 15-20 minutes. Per patient, was on salt substitute and had not had increased salt since she's been back. Patient still receiving B12 injections approximately once per month, next due 19th of this month.     Last clinic visit:   Per patient, had COVID the first week of May. In addition, had Shingles shot last week where symptoms came on again. Vertigo seems to be the biggest problem, episodic. Can go months between events, so the last couple months have been unusual. Denies any other symptoms currently.  States hearing has been slightly worsening with new hearing aid settings last year. She continues to follow with her audiologist. No other concerns at this time.     ===============================================       EVALUATION/TESTING:  Labs  ANA titer 80, equivocal  Vitami D 26.6 (03/26/18)  B12: 338 (03/26/18)    Physiological Testing  Routine EEG: -  EMU/ vEEG:  -    Basic Structural Imaging (personally reviewed unless otherwise stated)  MRI brain wwo [facility], 01/15/2019):  1. Stable MRI of the head. Unchanged nonenhancing supratentorial white matter lesions. Differential considerations include primary demyelinating disorder, chronic small vessel ischemia, migraine headaches or sequela of prior infectious or inflammatory etiologies.  2. No new lesions. No enhancing lesions.    MRI T/ L Spine ([place], 03/25/18)  IMPRESSION   1. ?No thoracic/lumbar spinal cord signal abnormality or abnormal   enhancement.   2. ?No significant spinal or foraminal stenosis in the thoracic spine.   3. ?Prior L4-L5 laminectomies. 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     MRI C-Spine [facility], 02/06/18)  C-spine:  1.  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  2.  Multilevel degenerative neural foraminal stenosis, greatest of marked on the right at C3-C4 and C5-C6.  3.  Normal signal of the cervical cord without enhancing lesion.    CT Int Hermenia Bers [facility], 05/14/18):  IMPRESSION   1. 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   2. No evidence of temporal bone fracture or encephalocele.   3. Chronic appearing inferior mastoid sclerosis and mild mucosal   thickening or trace effusion fluid favored for sequelae of old   inflammation      ===============================================  OTHER HISTORY  Past Medical History:  Medical History:   Diagnosis Date   ? Pancreatitis        Past Surgical History:  Surgical History:   Procedure Laterality Date   ? CHOLECYSTECTOMY  1998   ? HYSTERECTOMY  2000   ? BACK SURGERY  1985, 2017       Family History:  Family History   Problem Relation Age of Onset   ? Cancer Father    ? Thyroid Disease Brother        Social History:  Social History     Tobacco Use   ? Smoking status: Former     Types: Cigarettes     Quit date: 1974     Years since quitting: 49.0   ? Smokeless tobacco: Never   Substance Use Topics   ? Alcohol use: Not Currently   ? Drug use: Not Currently       Allergies:  Allergies   Allergen Reactions   ? Latex, Natural Rubber FLUSHING (SKIN) and RASH   ? Penicillins HIVES   ? Cefaclor NAUSEA AND VOMITING   ? Erythromycin NAUSEA AND VOMITING       Medications:    Current Outpatient Medications:   ?  aspirin EC 81 mg tablet, Take 81 mg by mouth daily., Disp: , Rfl:   ?  busPIRone (BUSPAR) 7.5 mg tablet, twice daily., Disp: , Rfl:   ?  diazePAM (VALIUM) 5 mg tablet, Take 1 tablet by mouth three times daily., Disp: , Rfl:   ?  fluoxetine (PROZAC) 20 mg capsule, Take 1 capsule by mouth daily., Disp: , Rfl:   ?  hydroCHLOROthiazide (HYDRODIURIL) 25 mg tablet, Take one tablet by mouth every morning., Disp: 90 tablet, Rfl: 3  ?  levothyroxine (SYNTHROID) 25 mcg tablet, Take 25 mcg by mouth daily 30 minutes before breakfast., Disp: , Rfl:   ?  ondansetron (ZOFRAN ODT) 8 mg rapid dissolve tablet, Dissolve 1 tablet by mouth three times daily., Disp: , Rfl:   ?  triamcinolone (NASACORT) 55 mcg nasal inhaler, Apply 1 spray into nose as directed daily., Disp: , Rfl:   ?  triamterene-hydrochlorothiazide (DYAZIDE) 37.5-25 mg capsule, Take one capsule by mouth every morning., Disp: 90 capsule, Rfl: 0    Review of Systems:  Review of Systems   Constitutional: Negative for chills, fever and malaise/fatigue.   HENT: Positive for hearing loss (chronic).    Eyes: Negative for double vision.   Respiratory: Negative for cough.    Cardiovascular: Negative for chest pain and palpitations.   Genitourinary: Negative for dysuria.   Musculoskeletal: Negative for myalgias.   Neurological: Positive for dizziness. Negative for seizures, loss of consciousness and headaches.   Psychiatric/Behavioral: Negative for depression and memory loss.          PHYSICAL EXAMINATION:  Vitals:   Vitals:    06/01/21 1256   PainSc: Zero   Weight: 44.9 kg (99 lb)   Height: 152.4 cm (5') General:  No scleral icterus  Moist mucus membranes  Peripheral pulses Intact  Aerating well  Abdomen soft  No peripheral edema  Skin dry    Neurologic Exam:  Mental status:  ? Alert and oriented x 3  ? Recent and remote memory intact  ? Concentration and attention intact  ? Fund of knowledge intact; mood and affect normal  ? Language including naming, repetition, comprehension is normal and spontaneous speech is normal    Cranial nerves:  ? Eyes - Fundoscopic exam deferred  ? Visual acuity is normal and visual fields to confrontation are normal  ? Ocular motility full and pupils are equal round and reactive to light, no RAPD  ? Facial sensation intact in V1-V3   ? Face symmetric and strength normal  ? Hearing decreased to finger rustle bilaterally, R > L  ? Palate elevation, tongue protrusion deferred due to COVID pandemic  ? Shoulder shrug is symmetric    Motor:  ? Muscle bulk and tone are normal in all extremities  ? Muscle strength is normal to confrontation   Reflexes:  ? 1+ and symmetric in all four extremities    Sensory:  ? Sensory exam is normal to light touch and temperature    Coordination:  ? Finger to nose testing without dysmetria    Gait:  ? Casual gait and station are normal

## 2021-06-02 ENCOUNTER — Ambulatory Visit: Admit: 2021-06-02 | Discharge: 2021-06-02 | Payer: MEDICARE

## 2021-06-02 ENCOUNTER — Encounter: Admit: 2021-06-02 | Discharge: 2021-06-02 | Payer: 59

## 2021-06-02 ENCOUNTER — Ambulatory Visit: Admit: 2021-06-02 | Discharge: 2021-06-02 | Payer: 59

## 2021-06-02 DIAGNOSIS — H903 Sensorineural hearing loss, bilateral: Secondary | ICD-10-CM

## 2021-06-02 DIAGNOSIS — Z006 Encounter for examination for normal comparison and control in clinical research program: Secondary | ICD-10-CM

## 2021-06-02 NOTE — Progress Notes
Testing performed today in compliance with research study. Patient to follow-up per study protocol.

## 2021-06-22 ENCOUNTER — Encounter: Admit: 2021-06-22 | Discharge: 2021-06-22 | Payer: 59

## 2021-06-22 MED ORDER — TRIAMTERENE-HYDROCHLOROTHIAZID 37.5-25 MG PO CAP
1 | ORAL_CAPSULE | Freq: Every morning | ORAL | 3 refills | Status: AC
Start: 2021-06-22 — End: ?

## 2021-08-04 ENCOUNTER — Encounter: Admit: 2021-08-04 | Discharge: 2021-08-04 | Payer: 59

## 2021-08-04 MED ORDER — TRIAMTERENE-HYDROCHLOROTHIAZID 37.5-25 MG PO CAP
1 | ORAL_CAPSULE | Freq: Every morning | ORAL | 3 refills | Status: AC
Start: 2021-08-04 — End: ?

## 2021-08-23 ENCOUNTER — Ambulatory Visit: Admit: 2021-08-23 | Discharge: 2021-08-23 | Payer: MEDICARE

## 2021-08-23 ENCOUNTER — Encounter: Admit: 2021-08-23 | Discharge: 2021-08-23 | Payer: MEDICARE

## 2021-08-23 DIAGNOSIS — H903 Sensorineural hearing loss, bilateral: Secondary | ICD-10-CM

## 2021-08-23 NOTE — Progress Notes
Testing performed today in compliance with research study. Patient to follow-up per study protocol.

## 2021-09-16 ENCOUNTER — Encounter: Admit: 2021-09-16 | Discharge: 2021-09-16 | Payer: MEDICARE

## 2021-09-29 ENCOUNTER — Encounter: Admit: 2021-09-29 | Discharge: 2021-09-29 | Payer: MEDICARE

## 2021-09-30 NOTE — Progress Notes
Fell this evening from standing.    CT head shows "tiny" SAH.    VS:  123/72 - 71- RR 16 sats 96% on RA. A & O x 4.    Takes ASA daily, no other blood thinners.    Reason for request: No Neurology or Neurosurgery at sending facility.    Provider states patient would be floor status as SAH has a "tiny focus" and she is completely stable at this time.    **Transfer request denied for capacity per PA guidance

## 2021-10-06 ENCOUNTER — Encounter: Admit: 2021-10-06 | Discharge: 2021-10-06 | Payer: MEDICARE

## 2021-10-18 ENCOUNTER — Encounter: Admit: 2021-10-18 | Discharge: 2021-10-18 | Payer: MEDICARE

## 2021-11-01 ENCOUNTER — Encounter: Admit: 2021-11-01 | Discharge: 2021-11-01 | Payer: MEDICARE

## 2021-11-08 ENCOUNTER — Encounter: Admit: 2021-11-08 | Discharge: 2021-11-08 | Payer: MEDICARE

## 2021-11-15 ENCOUNTER — Encounter: Admit: 2021-11-15 | Discharge: 2021-11-15 | Payer: MEDICARE

## 2021-11-22 ENCOUNTER — Encounter: Admit: 2021-11-22 | Discharge: 2021-11-22 | Payer: MEDICARE

## 2021-11-22 ENCOUNTER — Ambulatory Visit: Admit: 2021-11-22 | Discharge: 2021-11-22 | Payer: MEDICARE

## 2021-11-22 DIAGNOSIS — Z006 Encounter for examination for normal comparison and control in clinical research program: Secondary | ICD-10-CM

## 2021-11-22 DIAGNOSIS — H903 Sensorineural hearing loss, bilateral: Secondary | ICD-10-CM

## 2021-11-22 LAB — RESEARCH BLOOD COLLECTION ONLY

## 2021-11-22 NOTE — Progress Notes
Testing performed today in compliance with research study. Patient to follow-up per study protocol.

## 2021-11-28 ENCOUNTER — Ambulatory Visit: Admit: 2021-11-28 | Discharge: 2021-11-29 | Payer: MEDICARE

## 2021-11-28 ENCOUNTER — Encounter: Admit: 2021-11-28 | Discharge: 2021-11-28 | Payer: MEDICARE

## 2021-11-28 DIAGNOSIS — K859 Acute pancreatitis without necrosis or infection, unspecified: Secondary | ICD-10-CM

## 2021-11-29 DIAGNOSIS — G63 Polyneuropathy in diseases classified elsewhere: Secondary | ICD-10-CM

## 2021-11-29 DIAGNOSIS — R42 Dizziness and giddiness: Secondary | ICD-10-CM

## 2021-11-29 DIAGNOSIS — G379 Demyelinating disease of central nervous system, unspecified: Secondary | ICD-10-CM

## 2021-12-01 ENCOUNTER — Encounter: Admit: 2021-12-01 | Discharge: 2021-12-01 | Payer: MEDICARE

## 2021-12-29 ENCOUNTER — Encounter: Admit: 2021-12-29 | Discharge: 2021-12-29 | Payer: MEDICARE

## 2022-01-04 ENCOUNTER — Ambulatory Visit: Admit: 2022-01-04 | Discharge: 2022-01-04 | Payer: MEDICARE

## 2022-01-04 ENCOUNTER — Encounter: Admit: 2022-01-04 | Discharge: 2022-01-04 | Payer: MEDICARE

## 2022-01-04 DIAGNOSIS — R42 Dizziness and giddiness: Secondary | ICD-10-CM

## 2022-01-04 DIAGNOSIS — G379 Demyelinating disease of central nervous system, unspecified: Secondary | ICD-10-CM

## 2022-01-04 MED ORDER — GADOBENATE DIMEGLUMINE 529 MG/ML (0.1MMOL/0.2ML) IV SOLN
10 mL | Freq: Once | INTRAVENOUS | 0 refills | Status: CP
Start: 2022-01-04 — End: ?
  Administered 2022-01-04: 18:00:00 10 mL via INTRAVENOUS

## 2022-01-13 ENCOUNTER — Encounter: Admit: 2022-01-13 | Discharge: 2022-01-13 | Payer: MEDICARE

## 2022-01-17 ENCOUNTER — Encounter: Admit: 2022-01-17 | Discharge: 2022-01-17 | Payer: MEDICARE

## 2022-01-18 ENCOUNTER — Encounter: Admit: 2022-01-18 | Discharge: 2022-01-18 | Payer: MEDICARE

## 2022-01-18 ENCOUNTER — Ambulatory Visit: Admit: 2022-01-18 | Discharge: 2022-01-18 | Payer: MEDICARE

## 2022-02-21 ENCOUNTER — Encounter: Admit: 2022-02-21 | Discharge: 2022-02-21 | Payer: MEDICARE

## 2022-02-21 ENCOUNTER — Ambulatory Visit: Admit: 2022-02-21 | Discharge: 2022-02-21 | Payer: MEDICARE

## 2022-02-21 DIAGNOSIS — H903 Sensorineural hearing loss, bilateral: Secondary | ICD-10-CM

## 2022-02-21 DIAGNOSIS — Z006 Encounter for examination for normal comparison and control in clinical research program: Secondary | ICD-10-CM

## 2022-02-21 LAB — RESEARCH BLOOD COLLECTION ONLY

## 2022-02-21 NOTE — Progress Notes
Testing performed today in compliance with research study. Patient to follow-up per study protocol.

## 2022-02-23 ENCOUNTER — Encounter: Admit: 2022-02-23 | Discharge: 2022-02-23 | Payer: MEDICARE

## 2022-05-18 ENCOUNTER — Ambulatory Visit: Admit: 2022-05-18 | Discharge: 2022-05-18 | Payer: MEDICARE

## 2022-05-18 ENCOUNTER — Encounter: Admit: 2022-05-18 | Discharge: 2022-05-18 | Payer: MEDICARE

## 2022-05-18 DIAGNOSIS — H903 Sensorineural hearing loss, bilateral: Secondary | ICD-10-CM

## 2022-05-18 NOTE — Progress Notes
Patient was seen for research testing.

## 2022-07-17 ENCOUNTER — Encounter: Admit: 2022-07-17 | Discharge: 2022-07-17 | Payer: MEDICARE

## 2022-07-17 ENCOUNTER — Ambulatory Visit: Admit: 2022-07-17 | Discharge: 2022-07-18 | Payer: MEDICARE

## 2022-07-17 DIAGNOSIS — R42 Dizziness and giddiness: Secondary | ICD-10-CM

## 2022-07-17 DIAGNOSIS — H903 Sensorineural hearing loss, bilateral: Secondary | ICD-10-CM

## 2022-07-17 DIAGNOSIS — H8103 Meniere's disease, bilateral: Secondary | ICD-10-CM

## 2022-07-17 DIAGNOSIS — K859 Acute pancreatitis without necrosis or infection, unspecified: Secondary | ICD-10-CM

## 2022-07-17 DIAGNOSIS — G379 Demyelinating disease of central nervous system, unspecified: Secondary | ICD-10-CM

## 2022-07-17 NOTE — Progress Notes
[facility]  Neuroimmunology/ Neuroinfectious Disease  Return Patient Visit  ===========================================================================  Impression:  1. Sensorineural hearing loss (SNHL) of both ears        2. Vertigo        3. Demyelinating changes in brain (HCC)        4. Meniere disease, bilateral             Rachael Edwards is a 72 y.o. female presenting for followup for previous white matter changes with concern for multiple sclerosis vs clinically isolated syndrome (CIS). Her serial imaging since 2020 has not demonstrated new lesions on MRI brain or spine. She has remained well without initiation of DMT. 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 She's recently finished a clinical trial through [facility] in December 2023. Her imaging on last appointment has been stable without evidence of new lesions or progressive cerebellar changes. Overall she remains very well.     Plan/ Recommendations:  Diagnostic testing:  - None new at appointment. MRI from 01/04/22 personally reviewed and stable with evidence of chronic MS lesions without new inflammation.    Treatment:  - Continue low Na diet as have been for Menierre's disease  - Continue Vit D supplementation, D3 5000 IU/ daily  - No DMT indication currently.     Referrals:  - None.    Follow-up:  - 1 year with me in Thursday specialty clinic    Considerable time was spent on counseling the patient on the following, taking time to answer their quesions for which they verbalized understanding:  - The diagnosis was discussed including its probable causes, an explanation of the terminology, and the prognosis.     - The plan for evaluation and the treatment.    Waldron Session, M.D.  [facility]  [facility]      ===============================================    Chief complaint:  dizziness    Sources of history: patient.    Interval history:  Doing well since appointment without breakthrough events. Her last MRI was personally reviewed without evidence of progression of disease or new lesions seen. She remains off DMTs with good treatment of her Menierres largely through lifestyle modification. No new symptoms noted.     Previous history, updated and reviewed.  Patient is a 72 y.o. female presenting for further evaluation.  Patient previously followed by Dr. Charline Bills for ongoing dizziness with question of CNS lesions consistent with MS. On prior workup and surveillance imaging, patient with numerous supratentorial periventricular and subcortical white matter lesions consistent with previous disease. However, there has been no enhancement or new lesions seen on subsequent imaging, last obtained in 01/2019. Patient with ongoing dizziness, diagnosed with Menierres disease by Dr. France Ravens, and helped by low sodium diet.     Patient recently went on a trip earlier this month when she had her traditional vertigo. Because of balance problems, had two times where she had fell. Had her zofran and diazepam on board. Had nausea for 15-20 minutes. Per patient, was on salt substitute and had not had increased salt since she's been back. Patient still receiving B12 injections approximately once per month, next due 19th of this month.     Last clinic visit:   Per patient, had COVID the first week of May. In addition, had Shingles shot last week where symptoms came on again. Vertigo seems to be the biggest problem, episodic. Can go  months between events, so the last couple months have been unusual. Denies any other symptoms currently. States hearing has been slightly worsening with new hearing aid settings last year. She continues to follow with her audiologist. No other concerns at this time.     ===============================================       EVALUATION/TESTING:  Labs  ANA titer 80, equivocal  Vitami D 26.6 (03/26/18)  B12: 338 (03/26/18)    Physiological Testing  Routine EEG: -  EMU/ vEEG:  -    Basic Structural Imaging (personally reviewed unless otherwise stated)  MRI brain [facility], 01/04/22)  IMPRESSION   1. Foci of white matter signal abnormality including adjacent to lateral   ventricles.   2. Appearance compatible with chronic multiple sclerosis.   3. No abnormal contrast enhancement, demyelination.   4. Mild cerebral volume loss.   5. Mild white matter signal change consistent with mild microvascular   gliosis.   6. No change from February 10, 2021 and January 15, 2019     MRI brain [facility], 01/15/2019):  1. Stable MRI of the head. Unchanged nonenhancing supratentorial white matter lesions. Differential considerations include primary demyelinating disorder, chronic small vessel ischemia, migraine headaches or sequela of prior infectious or inflammatory etiologies.  2. No new lesions. No enhancing lesions.    MRI T/ L Spine ([place], 03/25/18)  IMPRESSION   1.  No thoracic/lumbar spinal cord signal abnormality or abnormal   enhancement.   2.  No significant spinal or foraminal stenosis in the thoracic spine.   3.  Prior L4-L5 laminectomies. 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     MRI C-Spine [facility], 02/06/18)  C-spine:  1.  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  2.  Multilevel degenerative neural foraminal stenosis, greatest of marked on the right at C3-C4 and C5-C6.  3.  Normal signal of the cervical cord without enhancing lesion.    CT Int [facility], 05/14/18):  IMPRESSION   1. 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   2. No evidence of temporal bone fracture or encephalocele.   3. Chronic appearing inferior mastoid sclerosis and mild mucosal   thickening or trace effusion fluid favored for sequelae of old   inflammation ===============================================  OTHER HISTORY  Past Medical History:  Medical History:   Diagnosis Date    Pancreatitis        Past Surgical History:  Surgical History:   Procedure Laterality Date    CHOLECYSTECTOMY  1998    HYSTERECTOMY  2000    BACK SURGERY  1985, 2017       Family History:  Family History   Problem Relation Age of Onset    Cancer Father     Thyroid Disease Brother        Social History:  Social History     Tobacco Use    Smoking status: Former     Types: Cigarettes     Quit date: 1974     Years since quitting: 50.1    Smokeless tobacco: Never   Substance Use Topics    Alcohol use: Not Currently    Drug use: Not Currently       Allergies:  Allergies   Allergen Reactions    Latex, Natural Rubber FLUSHING (SKIN) and RASH    Penicillins HIVES    Cefaclor NAUSEA AND VOMITING  Erythromycin NAUSEA AND VOMITING       Medications:    Current Outpatient Medications:     aspirin EC 81 mg tablet, Take one tablet by mouth daily., Disp: , Rfl:     atorvastatin (LIPITOR) 10 mg tablet, Take one tablet by mouth at bedtime daily., Disp: , Rfl:     busPIRone (BUSPAR) 7.5 mg tablet, Take one tablet by mouth twice daily., Disp: , Rfl:     CHOLEcalciferoL (vitamin D3) 1,000 units tablet, Take one tablet by mouth daily., Disp: , Rfl:     diazePAM (VALIUM) 5 mg tablet, Take one tablet by mouth three times daily. As needed, Disp: , Rfl:     fluoxetine (PROZAC) 20 mg capsule, Take one capsule by mouth daily., Disp: , Rfl:     hydroCHLOROthiazide (HYDRODIURIL) 25 mg tablet, Take one tablet by mouth every morning., Disp: 90 tablet, Rfl: 3    levothyroxine (SYNTHROID) 25 mcg tablet, Take one tablet by mouth daily 30 minutes before breakfast., Disp: , Rfl:     OMEPRAZOLE PO, Take  by mouth daily., Disp: , Rfl:     ondansetron (ZOFRAN ODT) 8 mg rapid dissolve tablet, Dissolve one tablet by mouth three times daily. As needed, Disp: , Rfl:     triamcinolone (NASACORT) 55 mcg nasal inhaler, Apply one spray into nose as directed daily., Disp: , Rfl:     triamterene-hydrochlorothiazide (DYAZIDE) 37.5-25 mg capsule, TAKE ONE CAPSULE BY MOUTH EVERY MORNING (Patient taking differently: Take one capsule by mouth daily. 1 tablet every other day), Disp: 90 capsule, Rfl: 3    Review of Systems:  Review of Systems   Constitutional:  Negative for chills, fever and malaise/fatigue.   HENT:  Positive for hearing loss (chronic).    Eyes:  Negative for double vision.   Respiratory:  Negative for cough.    Cardiovascular:  Negative for chest pain and palpitations.   Genitourinary:  Negative for dysuria.   Musculoskeletal:  Negative for myalgias.   Neurological:  Negative for dizziness, seizures, loss of consciousness and headaches.   Psychiatric/Behavioral:  Negative for depression and memory loss.           PHYSICAL EXAMINATION:  Vitals:   Vitals:    07/17/22 1502   BP: 119/81   Pulse: 88   SpO2: 99%   Weight: 43.5 kg (95 lb 12.8 oz)   Height: 152.4 cm (5')      General:  No scleral icterus  Moist mucus membranes  Peripheral pulses Intact  Aerating well  Abdomen soft  No peripheral edema  Skin dry    Neurologic Exam:  Mental status:  ? Alert and oriented x 3  ? Recent and remote memory intact  ? Concentration and attention intact  ? Fund of knowledge intact; mood and affect normal  ? Language including naming, repetition, comprehension is normal and spontaneous speech is normal    Cranial nerves:  ? Eyes - Fundoscopic exam deferred  ? Visual acuity is normal and visual fields to confrontation are normal  ? Ocular motility full and pupils are equal round and reactive to light, no RAPD  ? Facial sensation intact in V1-V3   ? Face symmetric and strength normal  ? Hearing decreased to finger rustle bilaterally  ? Palate elevation, tongue protrusion normal  ? Shoulder shrug is symmetric    Motor:  ? Muscle bulk and tone are normal in all extremities  ? Muscle strength is normal to confrontation     Reflexes:  ?  1+ and symmetric in all four extremities    Sensory:  ? Sensory exam is normal to light touch    Coordination:  ? Finger to nose testing without dysmetria    Gait:  ? Casual gait and station are normal

## 2022-10-01 ENCOUNTER — Encounter: Admit: 2022-10-01 | Discharge: 2022-10-01 | Payer: MEDICARE

## 2022-10-01 MED ORDER — TRIAMTERENE-HYDROCHLOROTHIAZID 37.5-25 MG PO CAP
1 | ORAL_CAPSULE | ORAL | 3 refills
Start: 2022-10-01 — End: ?

## 2023-02-06 ENCOUNTER — Encounter: Admit: 2023-02-06 | Discharge: 2023-02-06 | Payer: MEDICARE

## 2023-03-02 ENCOUNTER — Encounter: Admit: 2023-03-02 | Discharge: 2023-03-02 | Payer: MEDICARE

## 2023-05-03 ENCOUNTER — Encounter: Admit: 2023-05-03 | Discharge: 2023-05-03 | Payer: MEDICARE

## 2023-05-04 ENCOUNTER — Encounter: Admit: 2023-05-04 | Discharge: 2023-05-04 | Payer: MEDICARE

## 2023-05-10 ENCOUNTER — Encounter: Admit: 2023-05-10 | Discharge: 2023-05-10 | Payer: MEDICARE

## 2023-05-10 ENCOUNTER — Ambulatory Visit: Admit: 2023-05-10 | Discharge: 2023-05-10 | Payer: MEDICARE

## 2023-07-19 ENCOUNTER — Encounter: Admit: 2023-07-19 | Discharge: 2023-07-19 | Payer: MEDICARE

## 2024-04-14 ENCOUNTER — Encounter: Admit: 2024-04-14 | Discharge: 2024-04-14 | Payer: MEDICARE

## 2024-04-16 ENCOUNTER — Encounter: Admit: 2024-04-16 | Discharge: 2024-04-16 | Payer: MEDICARE

## 2024-04-16 DIAGNOSIS — J984 Other disorders of lung: Principal | ICD-10-CM

## 2024-07-09 ENCOUNTER — Encounter: Admit: 2024-07-09 | Discharge: 2024-07-09 | Payer: MEDICARE
